# Patient Record
Sex: Female | Born: 1965 | Race: Black or African American | Hispanic: No | Marital: Single | Smoking: Never smoker
Health system: Southern US, Community
[De-identification: ages and names within clinical notes are randomized; demographics above are authoritative.]

## PROBLEM LIST (undated history)

## (undated) DIAGNOSIS — I509 Heart failure, unspecified: Secondary | ICD-10-CM

## (undated) DIAGNOSIS — E119 Type 2 diabetes mellitus without complications: Secondary | ICD-10-CM

## (undated) DIAGNOSIS — J449 Chronic obstructive pulmonary disease, unspecified: Secondary | ICD-10-CM

## (undated) DIAGNOSIS — G4730 Sleep apnea, unspecified: Secondary | ICD-10-CM

---

## 2005-07-18 ENCOUNTER — Emergency Department: Payer: Self-pay | Admitting: Unknown Physician Specialty

## 2006-05-09 ENCOUNTER — Emergency Department: Payer: Self-pay | Admitting: Internal Medicine

## 2006-09-04 ENCOUNTER — Ambulatory Visit: Payer: Self-pay

## 2007-02-18 ENCOUNTER — Inpatient Hospital Stay: Payer: Self-pay | Admitting: *Deleted

## 2007-02-18 ENCOUNTER — Other Ambulatory Visit: Payer: Self-pay

## 2007-02-25 ENCOUNTER — Ambulatory Visit: Payer: Self-pay | Admitting: Internal Medicine

## 2007-03-11 ENCOUNTER — Ambulatory Visit: Payer: Self-pay | Admitting: Family

## 2007-04-29 ENCOUNTER — Ambulatory Visit: Payer: Self-pay | Admitting: Family

## 2007-05-25 ENCOUNTER — Ambulatory Visit: Payer: Self-pay | Admitting: Family Medicine

## 2007-06-22 ENCOUNTER — Ambulatory Visit: Payer: Self-pay | Admitting: Family Medicine

## 2007-09-22 ENCOUNTER — Ambulatory Visit: Payer: Self-pay | Admitting: Family Medicine

## 2007-11-05 ENCOUNTER — Ambulatory Visit: Payer: Self-pay | Admitting: Family

## 2008-02-18 ENCOUNTER — Ambulatory Visit: Payer: Self-pay | Admitting: Family

## 2008-03-01 ENCOUNTER — Encounter: Payer: Self-pay | Admitting: Specialist

## 2008-03-22 ENCOUNTER — Encounter: Payer: Self-pay | Admitting: Specialist

## 2008-04-21 ENCOUNTER — Encounter: Payer: Self-pay | Admitting: Specialist

## 2008-05-22 ENCOUNTER — Inpatient Hospital Stay: Payer: Self-pay | Admitting: Internal Medicine

## 2008-05-24 ENCOUNTER — Encounter: Payer: Self-pay | Admitting: Specialist

## 2008-06-21 ENCOUNTER — Encounter: Payer: Self-pay | Admitting: Specialist

## 2008-07-07 ENCOUNTER — Ambulatory Visit: Payer: Self-pay | Admitting: Family

## 2008-10-06 ENCOUNTER — Ambulatory Visit: Payer: Self-pay | Admitting: Family

## 2009-01-03 ENCOUNTER — Ambulatory Visit: Payer: Self-pay | Admitting: Family Medicine

## 2009-01-19 ENCOUNTER — Ambulatory Visit: Payer: Self-pay | Admitting: Family Medicine

## 2009-02-01 ENCOUNTER — Ambulatory Visit: Payer: Self-pay | Admitting: Family

## 2009-02-09 ENCOUNTER — Ambulatory Visit: Payer: Self-pay | Admitting: Family

## 2009-02-14 ENCOUNTER — Encounter: Payer: Self-pay | Admitting: Specialist

## 2009-02-19 ENCOUNTER — Encounter: Payer: Self-pay | Admitting: Specialist

## 2009-02-19 ENCOUNTER — Ambulatory Visit: Payer: Self-pay | Admitting: Family Medicine

## 2009-03-15 ENCOUNTER — Ambulatory Visit: Payer: Self-pay | Admitting: Family Medicine

## 2009-03-22 ENCOUNTER — Ambulatory Visit: Payer: Self-pay | Admitting: Family Medicine

## 2009-03-22 ENCOUNTER — Encounter: Payer: Self-pay | Admitting: Specialist

## 2009-04-21 ENCOUNTER — Encounter: Payer: Self-pay | Admitting: Specialist

## 2009-05-22 ENCOUNTER — Encounter: Payer: Self-pay | Admitting: Specialist

## 2009-07-06 IMAGING — CR DG CHEST 2V
1 series · 2 of 2 positions shown · non-contrast
Comparison: none

REASON FOR EXAM: cough x 4 days
COMMENTS:

PROCEDURE:     DXR - DXR CHEST PA (OR AP) AND LATERAL  - May 25, 2007 [DATE]
RESULT:     A very mild area of increased density projects in the region of
the lingula.  No further focal regions of consolidation are appreciated.

[Series 1: view not recorded · 0.17mm/px · 2 of 2 slices shown]
[im 1/2]
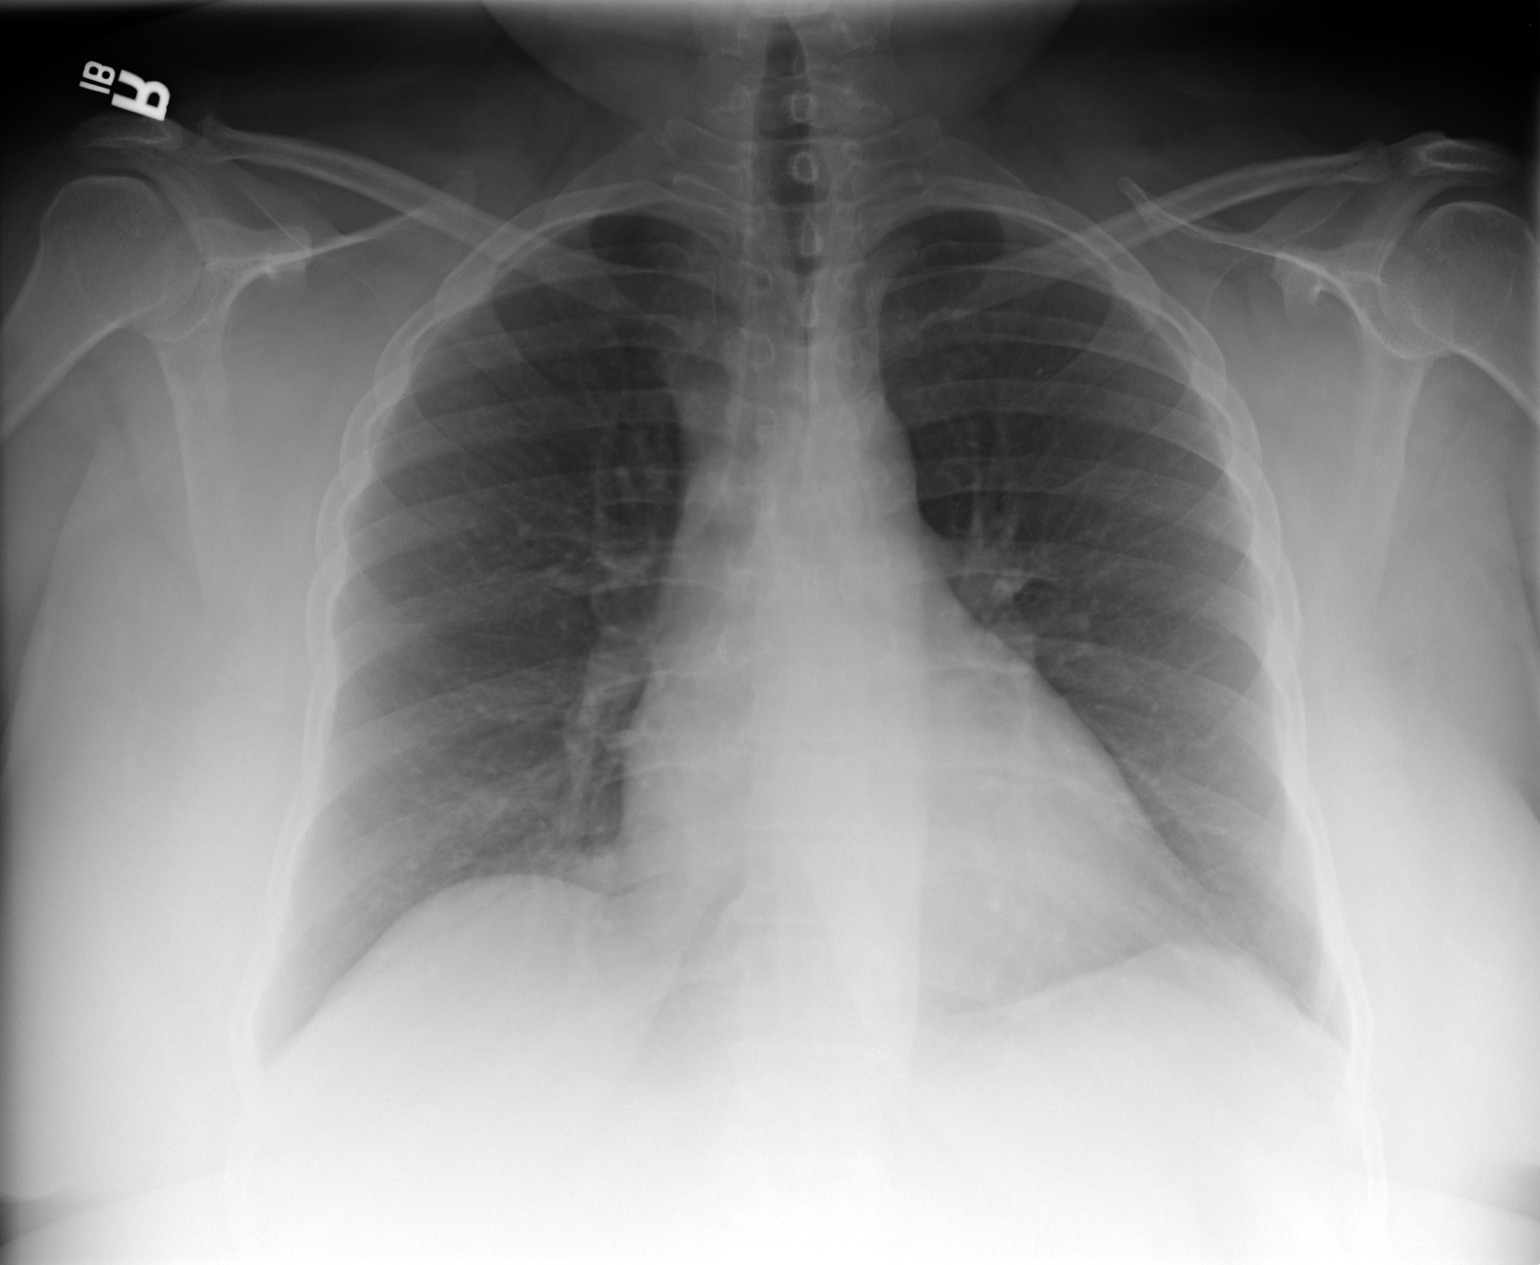
[im 2/2]
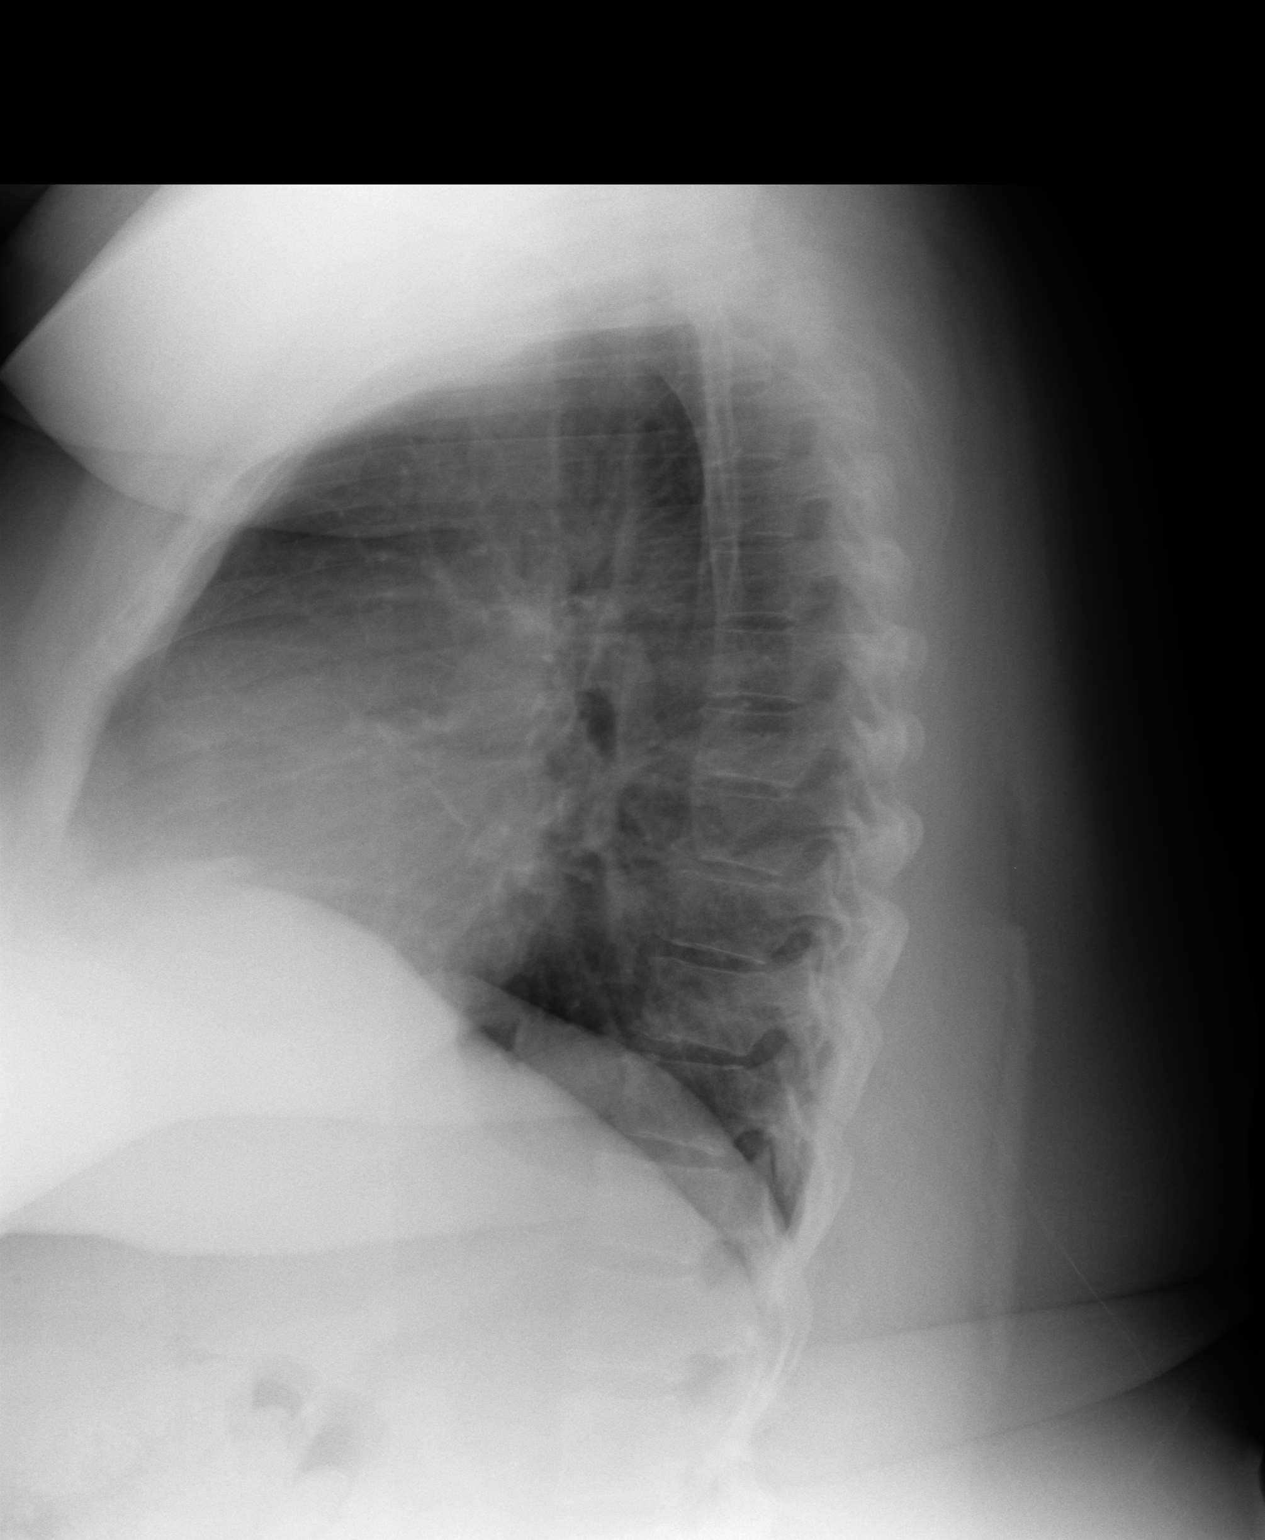

[2 of 2 positions shown; findings below may reference images not displayed]

IMPRESSION: 1)Mild atelectasis versus mild or early infiltrate in the region of the
lingula.  Surveillance evaluation is recommended if and as clinically
warranted.

## 2009-07-26 ENCOUNTER — Ambulatory Visit: Payer: Self-pay | Admitting: Family

## 2010-02-07 ENCOUNTER — Ambulatory Visit: Payer: Self-pay | Admitting: Specialist

## 2010-03-07 ENCOUNTER — Ambulatory Visit: Payer: Self-pay | Admitting: Family

## 2010-06-20 ENCOUNTER — Ambulatory Visit: Payer: Self-pay | Admitting: Family

## 2010-10-02 ENCOUNTER — Ambulatory Visit: Payer: Self-pay | Admitting: Family

## 2011-10-17 ENCOUNTER — Ambulatory Visit: Payer: Self-pay | Admitting: Family Medicine

## 2011-11-25 ENCOUNTER — Emergency Department: Payer: Self-pay | Admitting: Emergency Medicine

## 2012-01-01 ENCOUNTER — Ambulatory Visit: Payer: Self-pay | Admitting: Family

## 2012-05-04 ENCOUNTER — Encounter: Payer: Self-pay | Admitting: Specialist

## 2012-05-22 ENCOUNTER — Encounter: Payer: Self-pay | Admitting: Specialist

## 2012-06-21 ENCOUNTER — Encounter: Payer: Self-pay | Admitting: Specialist

## 2012-07-22 ENCOUNTER — Encounter: Payer: Self-pay | Admitting: Specialist

## 2012-08-22 ENCOUNTER — Encounter: Payer: Self-pay | Admitting: Specialist

## 2012-09-19 ENCOUNTER — Encounter: Payer: Self-pay | Admitting: Specialist

## 2012-10-20 ENCOUNTER — Encounter: Payer: Self-pay | Admitting: Specialist

## 2013-06-15 ENCOUNTER — Ambulatory Visit: Payer: Self-pay | Admitting: Family Medicine

## 2013-10-18 ENCOUNTER — Ambulatory Visit: Payer: Self-pay | Admitting: Family Medicine

## 2013-10-22 ENCOUNTER — Ambulatory Visit: Payer: Self-pay | Admitting: Family Medicine

## 2014-03-22 ENCOUNTER — Emergency Department: Payer: Self-pay | Admitting: Emergency Medicine

## 2016-05-03 IMAGING — CR DG CHEST 2V
1 series · 2 of 2 positions shown · non-contrast
Comparison: 06/22/2007

CLINICAL DATA: cough, sob. hx of copd

EXAM:
CHEST - 2 VIEW

[Series 1: w chest pa · 0.14mm/px · 2 of 2 slices shown]
[im 1/2]
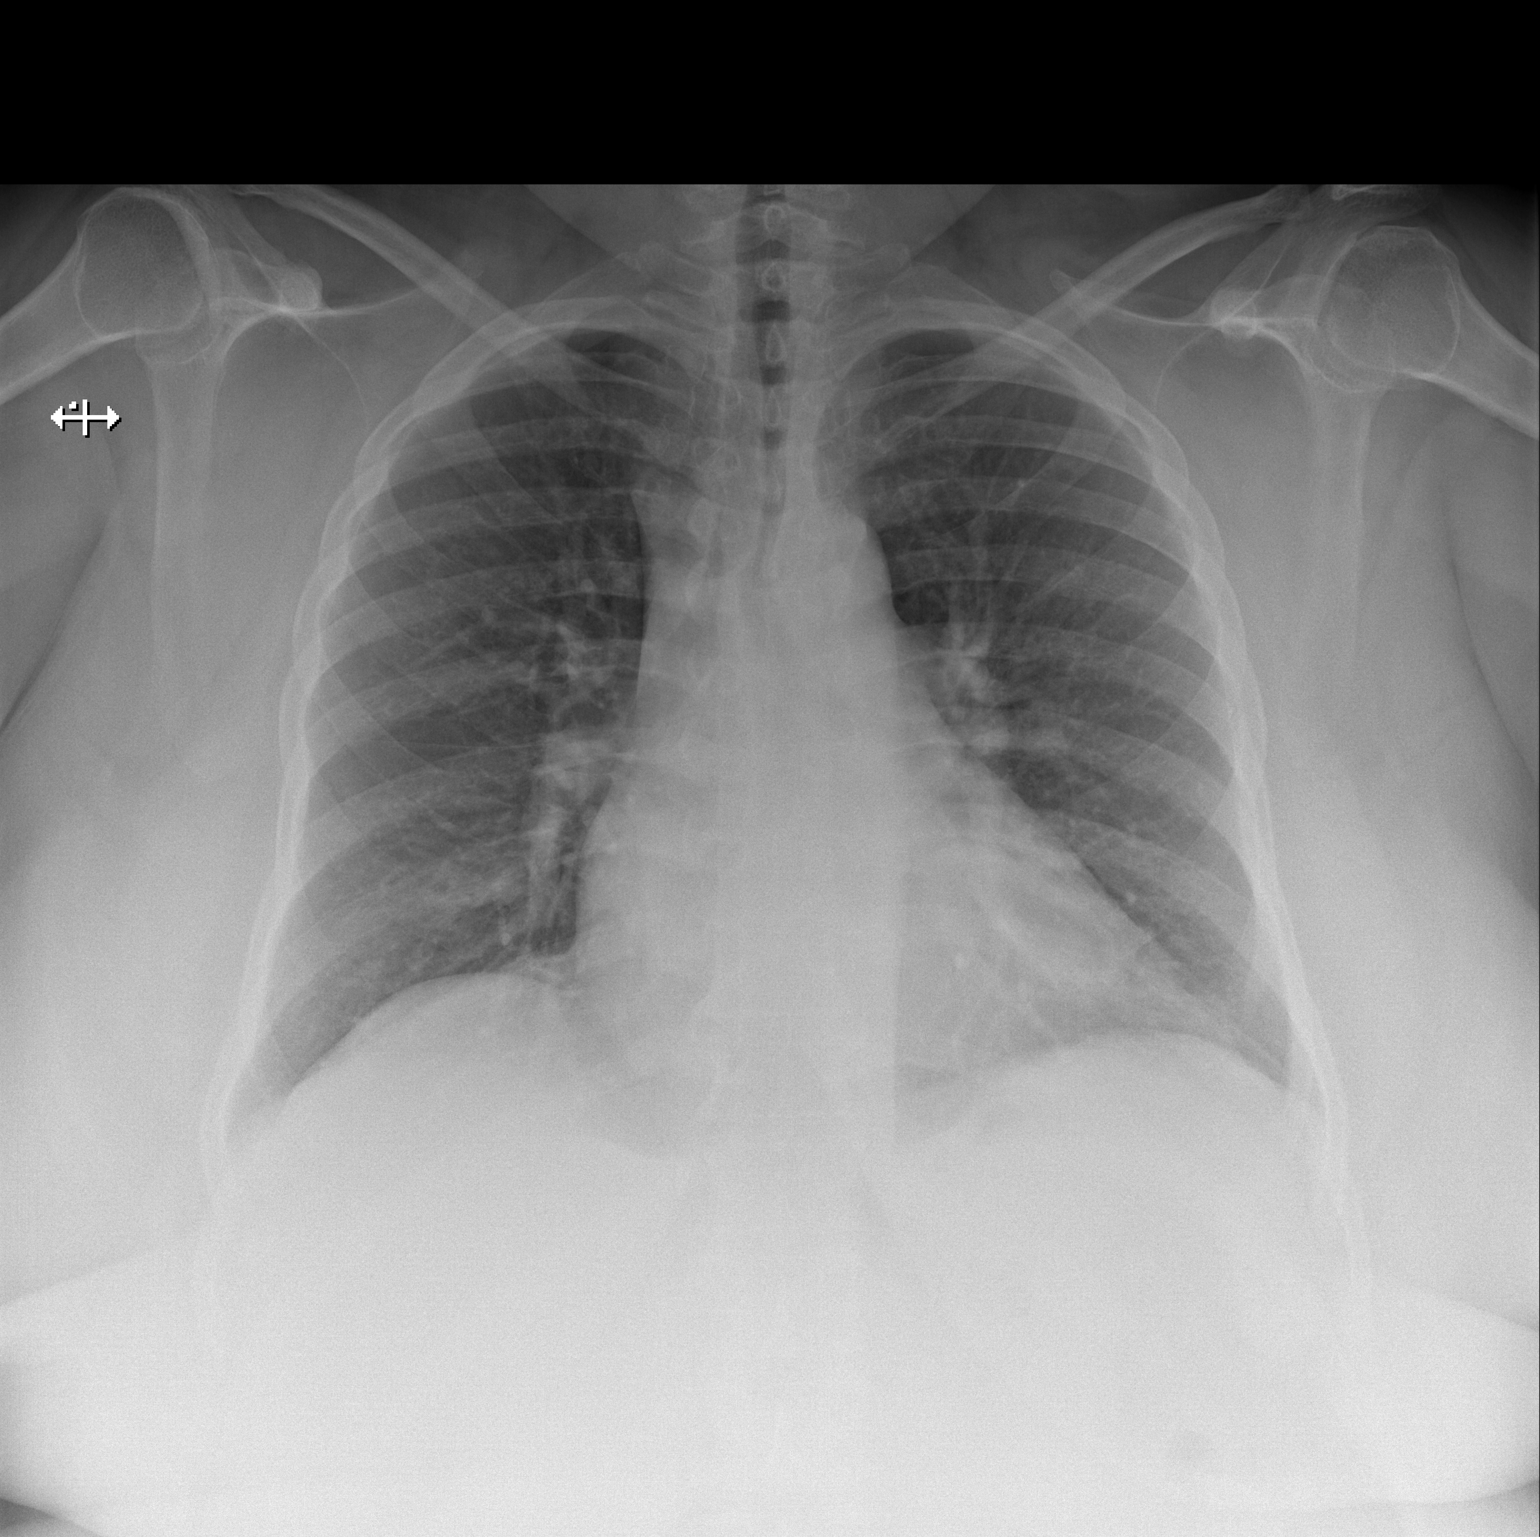
[im 2/2]
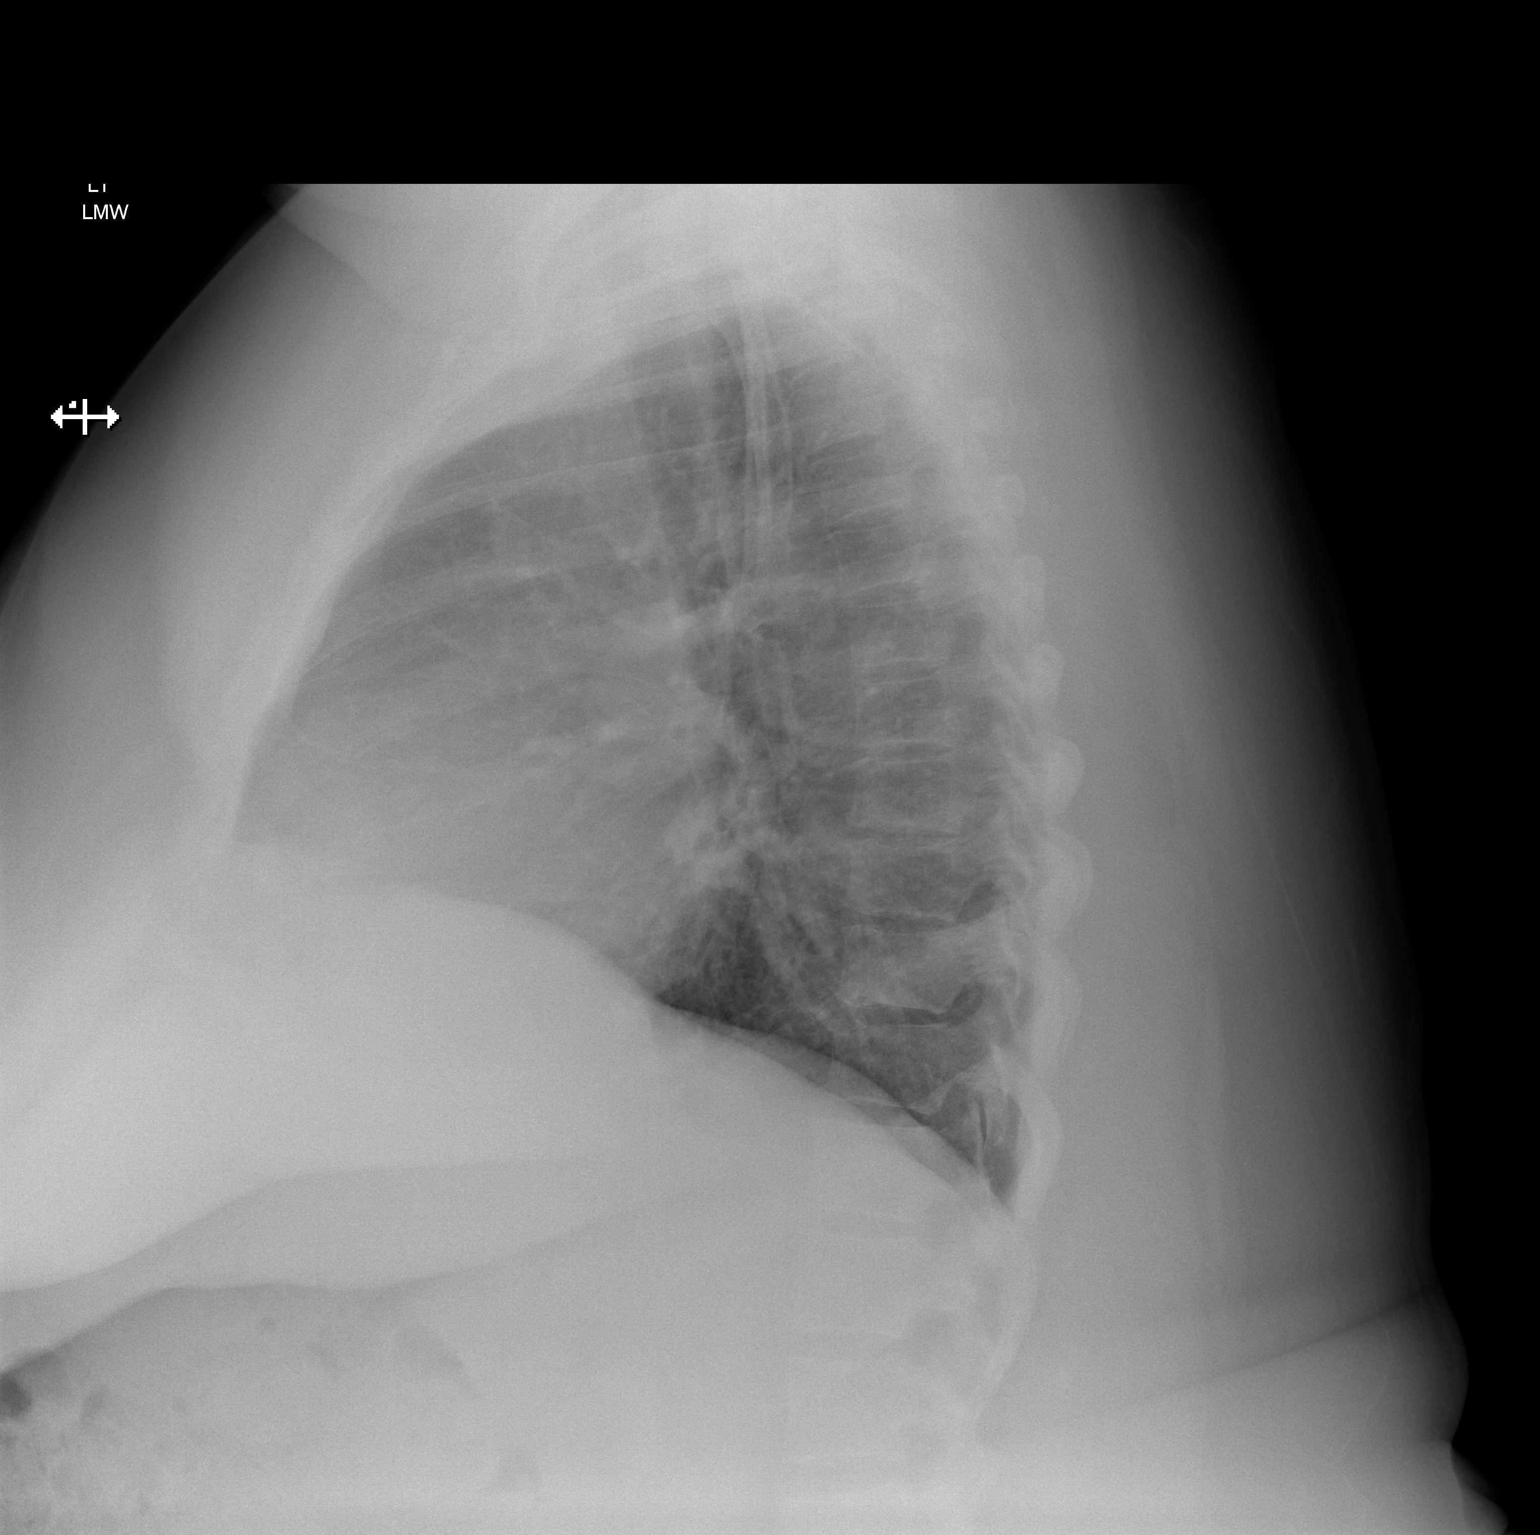

[2 of 2 positions shown; findings below may reference images not displayed]

FINDINGS: Lungs are clear. Stable mild cardiomegaly. The mediastinal contours
are within normal limits.
No effusion.
Visualized skeletal structures are unremarkable.
IMPRESSION: 1. Stable cardiomegaly.  No acute disease.

## 2016-11-29 ENCOUNTER — Other Ambulatory Visit: Payer: Self-pay | Admitting: Family Medicine

## 2016-11-29 DIAGNOSIS — Z1231 Encounter for screening mammogram for malignant neoplasm of breast: Secondary | ICD-10-CM

## 2017-11-27 ENCOUNTER — Ambulatory Visit: Payer: Medicare HMO | Attending: Specialist

## 2017-11-27 DIAGNOSIS — G4733 Obstructive sleep apnea (adult) (pediatric): Secondary | ICD-10-CM | POA: Insufficient documentation

## 2017-12-24 ENCOUNTER — Ambulatory Visit: Payer: Medicare HMO | Attending: Specialist

## 2017-12-24 DIAGNOSIS — G4733 Obstructive sleep apnea (adult) (pediatric): Secondary | ICD-10-CM | POA: Insufficient documentation

## 2018-01-19 ENCOUNTER — Other Ambulatory Visit: Payer: Self-pay | Admitting: Family Medicine

## 2018-01-19 ENCOUNTER — Ambulatory Visit
Admission: RE | Admit: 2018-01-19 | Discharge: 2018-01-19 | Disposition: A | Discharge status: Home / Self Care | Payer: Medicare HMO | Source: Ambulatory Visit | Attending: Family Medicine | Admitting: Family Medicine

## 2018-01-19 DIAGNOSIS — M25511 Pain in right shoulder: Secondary | ICD-10-CM | POA: Diagnosis present

## 2018-01-19 DIAGNOSIS — M19011 Primary osteoarthritis, right shoulder: Secondary | ICD-10-CM | POA: Diagnosis not present

## 2019-10-29 ENCOUNTER — Other Ambulatory Visit: Payer: Self-pay | Admitting: Family Medicine

## 2019-10-29 DIAGNOSIS — Z1231 Encounter for screening mammogram for malignant neoplasm of breast: Secondary | ICD-10-CM

## 2021-04-18 ENCOUNTER — Other Ambulatory Visit: Payer: Self-pay | Admitting: Family Medicine

## 2021-04-18 DIAGNOSIS — Z1231 Encounter for screening mammogram for malignant neoplasm of breast: Secondary | ICD-10-CM

## 2021-11-20 ENCOUNTER — Encounter (HOSPITAL_BASED_OUTPATIENT_CLINIC_OR_DEPARTMENT_OTHER): Payer: Self-pay

## 2021-11-20 DIAGNOSIS — G4733 Obstructive sleep apnea (adult) (pediatric): Secondary | ICD-10-CM

## 2021-11-20 DIAGNOSIS — G4709 Other insomnia: Secondary | ICD-10-CM

## 2022-01-01 ENCOUNTER — Other Ambulatory Visit: Payer: Self-pay

## 2022-01-01 ENCOUNTER — Emergency Department
Admission: EM | Admit: 2022-01-01 | Discharge: 2022-01-01 | Disposition: A | Discharge status: Home / Self Care | Payer: Medicare HMO | Attending: Emergency Medicine | Admitting: Emergency Medicine

## 2022-01-01 ENCOUNTER — Encounter: Payer: Self-pay | Admitting: Emergency Medicine

## 2022-01-01 DIAGNOSIS — E119 Type 2 diabetes mellitus without complications: Secondary | ICD-10-CM | POA: Insufficient documentation

## 2022-01-01 DIAGNOSIS — M79604 Pain in right leg: Secondary | ICD-10-CM | POA: Diagnosis present

## 2022-01-01 DIAGNOSIS — G8929 Other chronic pain: Secondary | ICD-10-CM | POA: Diagnosis not present

## 2022-01-01 DIAGNOSIS — J45909 Unspecified asthma, uncomplicated: Secondary | ICD-10-CM | POA: Insufficient documentation

## 2022-01-01 DIAGNOSIS — I10 Essential (primary) hypertension: Secondary | ICD-10-CM | POA: Insufficient documentation

## 2022-01-01 DIAGNOSIS — M79605 Pain in left leg: Secondary | ICD-10-CM | POA: Diagnosis not present

## 2022-01-01 HISTORY — DX: Sleep apnea, unspecified: G47.30

## 2022-01-01 HISTORY — DX: Type 2 diabetes mellitus without complications: E11.9

## 2022-01-01 HISTORY — DX: Heart failure, unspecified: I50.9

## 2022-01-01 HISTORY — DX: Chronic obstructive pulmonary disease, unspecified: J44.9

## 2022-01-01 NOTE — ED Provider Notes (Signed)
[facility] Provider Note    Event Date/Time   First MD Initiated Contact with Patient 01/01/22 0630     (approximate)   History   Leg Pain   HPI  Alison Barber is a 56 y.o. female who presents to the ED for evaluation of Leg Pain   I reviewed pulmonology clinic visit from 5/19.  Morbidly obese patient with a BMI of 60, history of asthma, DM, HTN, HLD.   Patient presents to the ED for evaluation of chronic bilateral leg pain.  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  She reports the pain has been more intense for the past 1 month or so so she presents to the ED this morning.  Reports taking NSAIDs and gabapentin at home with minimal improvement. No falls or injuries, fever, chest pain, increased shortness of breath  Physical Exam   Triage Vital Signs: ED Triage Vitals  Enc Vitals Group     BP 01/01/22 0511 (!) 147/76     Pulse Rate 01/01/22 0511 86     Resp 01/01/22 0511 18     Temp 01/01/22 0511 97.9 F (36.6 C)     Temp Source 01/01/22 0511 Oral     SpO2 01/01/22 0511 100 %     Weight 01/01/22 0509 (!) 329 lb (149.2 kg)     Height 01/01/22 0509 5\' 2"  (1.575 m)     Head Circumference --      Peak Flow --      Pain Score 01/01/22 0508 7     Pain Loc --      Pain Edu? --      Excl. in GC? --     Most recent vital signs: Vitals:   01/01/22 0511  BP: (!) 147/76  Pulse: 86  Resp: 18  Temp: 97.9 F (36.6 C)  SpO2: 100%    General: Awake, no distress.  Morbidly obese.  Ambulatory independently.  Pleasant and conversational. CV:  Good peripheral perfusion.  Resp:  Normal effort.  Abd:  No distention.  MSK:  No deformity noted.  Bilateral legs are symmetric and without swelling, erythema, tenderness, deformity.  Passive range of motion intact bilaterally without pain. Neuro:  No focal deficits appreciated. Cranial nerves II through XII intact 5/5 strength and sensation in  all 4 extremities Other:     ED Results / Procedures / Treatments   Labs (all labs ordered are listed, but only abnormal results are displayed) Labs Reviewed - No data to display  EKG   RADIOLOGY   Official radiology report(s): No results found.  PROCEDURES and INTERVENTIONS:  Procedures  Medications - No data to display   IMPRESSION / MDM / ASSESSMENT AND PLAN / ED COURSE  I reviewed the triage vital signs and the nursing notes.  Differential diagnosis includes, but is not limited to, arthritis, DVT, cellulitis, venous stasis, superficial thrombophlebitis  Morbidly obese 56 year old female presents to the ED with chronic pain, without evidence of acute pathology and suitable for continued outpatient management.  She looks systemically well and has reassuring vital signs and examination.  Her legs appear normal without signs of soft tissue swelling, trauma, neurologic or vascular deficits.  No signs of cellulitis or DVT.  We discussed DVT and ultrasound for this, but she says she does not think it is a blood clot and declines ultrasound.  We discussed adding Tylenol to her regimen and we  discussed being wary of regular NSAIDs considering her comorbidities.  We discussed following up with her PCP and return precautions.      FINAL CLINICAL IMPRESSION(S) / ED DIAGNOSES   Final diagnoses:  Bilateral leg pain  Other chronic pain     Rx / DC Orders   ED Discharge Orders     None        Note:  This document was prepared using Dragon voice recognition software and may include unintentional dictation errors.   Delton [place], MD 01/01/22 [id number]

## 2022-01-01 NOTE — Discharge Instructions (Addendum)
Use Tylenol for pain and fevers.  Up to 1000 mg per dose, up to 4 times per day.  Do not take more than 4000 mg of Tylenol/acetaminophen within 24 hours..  

## 2022-01-01 NOTE — ED Notes (Signed)
E-signature pad unavailable - Pt verbalized understanding of D/C information - no additional concerns at this time.  

## 2022-01-01 NOTE — ED Triage Notes (Signed)
Patient ambulatory to triage with steady gait, without difficulty or distress noted; pt reports that her legs have been aching for last month, increasing over last several wks

## 2022-01-09 ENCOUNTER — Encounter (HOSPITAL_BASED_OUTPATIENT_CLINIC_OR_DEPARTMENT_OTHER): Payer: Medicare HMO | Admitting: Internal Medicine

## 2022-04-22 ENCOUNTER — Other Ambulatory Visit: Payer: Self-pay | Admitting: Family Medicine

## 2022-04-22 DIAGNOSIS — Z1231 Encounter for screening mammogram for malignant neoplasm of breast: Secondary | ICD-10-CM

## 2022-08-30 ENCOUNTER — Ambulatory Visit
Admission: RE | Admit: 2022-08-30 | Discharge: 2022-08-30 | Disposition: A | Discharge status: Home / Self Care | Payer: Medicare HMO | Source: Ambulatory Visit | Attending: Family Medicine | Admitting: Family Medicine

## 2022-08-30 DIAGNOSIS — Z1231 Encounter for screening mammogram for malignant neoplasm of breast: Secondary | ICD-10-CM | POA: Diagnosis not present

## 2023-04-11 LAB — COLOGUARD: COLOGUARD: NEGATIVE

## 2024-07-20 ENCOUNTER — Other Ambulatory Visit: Payer: Self-pay | Admitting: Family Medicine

## 2024-07-20 DIAGNOSIS — Z1231 Encounter for screening mammogram for malignant neoplasm of breast: Secondary | ICD-10-CM

## 2024-08-18 ENCOUNTER — Encounter

## 2024-08-23 ENCOUNTER — Encounter

## 2024-09-07 ENCOUNTER — Ambulatory Visit: Admitting: Podiatry

## 2024-09-16 ENCOUNTER — Encounter
